# Patient Record
Sex: Male | Born: 1970 | Race: White | Hispanic: No | Marital: Married | Smoking: Never smoker
Health system: Southern US, Community
[De-identification: ages and names within clinical notes are randomized; demographics above are authoritative.]

## PROBLEM LIST (undated history)

## (undated) DIAGNOSIS — D649 Anemia, unspecified: Secondary | ICD-10-CM

## (undated) HISTORY — DX: Anemia, unspecified: D64.9

## (undated) HISTORY — PX: INGUINAL LYMPHADENECTOMY: SHX6587

---

## 2012-01-07 HISTORY — PX: CERVICAL FUSION: SHX112

## 2018-11-30 ENCOUNTER — Encounter: Payer: Self-pay | Admitting: Internal Medicine

## 2018-11-30 ENCOUNTER — Ambulatory Visit (AMBULATORY_SURGERY_CENTER): Payer: BC Managed Care – PPO | Admitting: *Deleted

## 2018-11-30 ENCOUNTER — Other Ambulatory Visit: Payer: Self-pay

## 2018-11-30 ENCOUNTER — Encounter (INDEPENDENT_AMBULATORY_CARE_PROVIDER_SITE_OTHER): Payer: Self-pay

## 2018-11-30 VITALS — Temp 97.8°F | Ht 70.0 in | Wt 210.0 lb

## 2018-11-30 DIAGNOSIS — Z1159 Encounter for screening for other viral diseases: Secondary | ICD-10-CM

## 2018-11-30 DIAGNOSIS — Z1211 Encounter for screening for malignant neoplasm of colon: Secondary | ICD-10-CM

## 2018-11-30 MED ORDER — NA SULFATE-K SULFATE-MG SULF 17.5-3.13-1.6 GM/177ML PO SOLN: 1.0000 | Freq: Once | ORAL | 0 refills | Status: AC

## 2018-11-30 NOTE — Progress Notes (Signed)

## 2018-12-13 ENCOUNTER — Other Ambulatory Visit: Payer: Self-pay | Admitting: Internal Medicine

## 2018-12-13 ENCOUNTER — Ambulatory Visit (INDEPENDENT_AMBULATORY_CARE_PROVIDER_SITE_OTHER): Payer: BC Managed Care – PPO

## 2018-12-13 DIAGNOSIS — Z1159 Encounter for screening for other viral diseases: Secondary | ICD-10-CM

## 2018-12-14 LAB — SARS CORONAVIRUS 2 (TAT 6-24 HRS): SARS Coronavirus 2: NEGATIVE

## 2018-12-16 ENCOUNTER — Ambulatory Visit (AMBULATORY_SURGERY_CENTER): Payer: BC Managed Care – PPO | Admitting: Internal Medicine

## 2018-12-16 ENCOUNTER — Other Ambulatory Visit: Payer: Self-pay

## 2018-12-16 ENCOUNTER — Encounter: Payer: Self-pay | Admitting: Internal Medicine

## 2018-12-16 VITALS — BP 112/68 | HR 69 | Temp 97.9°F | Resp 15 | Ht 70.0 in | Wt 210.0 lb

## 2018-12-16 DIAGNOSIS — Z800 Family history of malignant neoplasm of digestive organs: Secondary | ICD-10-CM | POA: Diagnosis present

## 2018-12-16 DIAGNOSIS — D124 Benign neoplasm of descending colon: Secondary | ICD-10-CM

## 2018-12-16 DIAGNOSIS — Z1211 Encounter for screening for malignant neoplasm of colon: Secondary | ICD-10-CM

## 2018-12-16 MED ORDER — SODIUM CHLORIDE 0.9 % IV SOLN: 500.0000 mL | Freq: Once | INTRAVENOUS | Status: DC

## 2018-12-16 NOTE — Progress Notes (Signed)
Called to room to assist during endoscopic procedure.  Patient ID and intended procedure confirmed with present staff. Received instructions for my participation in the procedure from the performing physician.  

## 2018-12-16 NOTE — Progress Notes (Signed)
PT taken to PACU. Monitors in place. VSS. Report given to RN. 

## 2018-12-16 NOTE — Patient Instructions (Signed)
One polyp. Await pathology.  You will receive a letter in 1-3 weeks from Dr Hilarie Fredrickson in the mail.      YOU HAD AN ENDOSCOPIC PROCEDURE TODAY AT [facility]:   Refer to the procedure report that was given to you for any specific questions about what was found during the examination.  If the procedure report does not answer your questions, please call your gastroenterologist to clarify.  If you requested that your care partner not be given the details of your procedure findings, then the procedure report has been included in a sealed envelope for you to review at your convenience later.  YOU SHOULD EXPECT: Some feelings of bloating in the abdomen. Passage of more gas than usual.  Walking can help get rid of the air that was put into your GI tract during the procedure and reduce the bloating. If you had a lower endoscopy (such as a colonoscopy or flexible sigmoidoscopy) you may notice spotting of blood in your stool or on the toilet paper. If you underwent a bowel prep for your procedure, you may not have a normal bowel movement for a few days.  Please Note:  You might notice some irritation and congestion in your nose or some drainage.  This is from the oxygen used during your procedure.  There is no need for concern and it should clear up in a day or so.  SYMPTOMS TO REPORT IMMEDIATELY:   Following lower endoscopy (colonoscopy or flexible sigmoidoscopy):  Excessive amounts of blood in the stool  Significant tenderness or worsening of abdominal pains  Swelling of the abdomen that is new, acute  Fever of 100F or higher   For urgent or emergent issues, a gastroenterologist can be reached at any hour by calling [phone].   DIET:  We do recommend a small meal at first, but then you may proceed to your regular diet.  Drink plenty of fluids but you should avoid alcoholic beverages for 24 hours.  ACTIVITY:  You should plan to take it easy for the rest of today and you should  NOT DRIVE or use heavy machinery until tomorrow (because of the sedation medicines used during the test).    FOLLOW UP: Our staff will call the number listed on your records 48-72 hours following your procedure to check on you and address any questions or concerns that you may have regarding the information given to you following your procedure. If we do not reach you, we will leave a message.  We will attempt to reach you two times.  During this call, we will ask if you have developed any symptoms of COVID 19. If you develop any symptoms (ie: fever, flu-like symptoms, shortness of breath, cough etc.) before then, please call [phone].  If you test positive for Covid 19 in the 2 weeks post procedure, please call and report this information to [place].    If any biopsies were taken you will be contacted by phone or by letter within the next 1-3 weeks.  Please call us at [phone] if you have not heard about the biopsies in 3 weeks.    SIGNATURES/CONFIDENTIALITY: You and/or your care partner have signed paperwork which will be entered into your electronic medical record.  These signatures attest to the fact that that the information above on your After Visit Summary has been reviewed and is understood.  Full responsibility of the confidentiality of this discharge information lies with you and/or your care-partner.

## 2018-12-16 NOTE — Op Note (Signed)
[place] Patient Name: Hunter Lambert Procedure Date: 12/16/2018 9:31 AM MRN: [id number] Endoscopist: Jerene Bears , MD Age: 48 Referring MD:  Date of Birth: 24-Mar-1970 Gender: Male Account #: [id number] Procedure:                Colonoscopy Indications:              Screening patient at increased risk: Family history                            of 1st-degree relative with colorectal cancer at                            age 46 years (or older), This is the patient's                            first colonoscopy Medicines:                Monitored Anesthesia Care Procedure:                Pre-Anesthesia Assessment:                           - Prior to the procedure, a History and Physical                            was performed, and patient medications and                            allergies were reviewed. The patient's tolerance of                            previous anesthesia was also reviewed. The risks                            and benefits of the procedure and the sedation                            options and risks were discussed with the patient.                            All questions were answered, and informed consent                            was obtained. Prior Anticoagulants: The patient has                            taken no previous anticoagulant or antiplatelet                            agents. ASA Grade Assessment: I - A normal, healthy                            patient. After reviewing the risks and benefits,  the patient was deemed in satisfactory condition to                            undergo the procedure.                           After obtaining informed consent, the colonoscope                            was passed under direct vision. Throughout the                            procedure, the patient's blood pressure, pulse, and                            oxygen saturations were monitored continuously. The                    Colonoscope was introduced through the anus and                            advanced to the cecum. The colonoscopy was                            performed without difficulty. The patient tolerated                            the procedure well. The quality of the bowel                            preparation was good. The ileocecal valve,                            appendiceal orifice, and rectum were photographed. Scope In: 9:41:38 AM Scope Out: [id number] AM Scope Withdrawal Time: 0 hours 12 minutes 17 seconds  Total Procedure Duration: 0 hours 14 minutes 39 seconds  Findings:                 The digital rectal exam was normal.                           A 3 mm polyp was found in the descending colon. The                            polyp was sessile. The polyp was removed with a                            cold snare. Resection and retrieval were complete.                           The exam was otherwise without abnormality on                            direct and retroflexion views. Complications:            No  immediate complications. Estimated Blood Loss:     Estimated blood loss: none. Impression:               - One 3 mm polyp in the descending colon, removed                            with a cold snare. Resected and retrieved.                           - The examination was otherwise normal on direct                            and retroflexion views. Recommendation:           - Patient has a contact number available for                            emergencies. The signs and symptoms of potential                            delayed complications were discussed with the                            patient. Return to normal activities tomorrow.                            Written discharge instructions were provided to the                            patient.                           - Resume previous diet.                           - Continue present medications.                            - Await pathology results.                           - Repeat colonoscopy is recommended. The                            colonoscopy date will be determined after pathology                            results from today's exam become available for                            review. Jerene Bears, MD 12/16/2018 9:59:52 AM This report has been signed electronically.

## 2018-12-16 NOTE — Progress Notes (Signed)
VS-DT Temp-JB   Pt's states no medical or surgical changes since previsit or office visit.  

## 2018-12-20 ENCOUNTER — Telehealth: Payer: Self-pay | Admitting: *Deleted

## 2018-12-20 ENCOUNTER — Encounter: Payer: Self-pay | Admitting: Internal Medicine

## 2018-12-20 NOTE — Telephone Encounter (Signed)
  Follow up Call-  Call back number 12/16/2018  Post procedure Call Back phone  # [phone]  Permission to leave phone message Yes     Patient questions:  Do you have a fever, pain , or abdominal swelling? No. Pain Score  0 *  Have you tolerated food without any problems? Yes.    Have you been able to return to your normal activities? Yes.    Do you have any questions about your discharge instructions: Diet   No. Medications  No. Follow up visit  No.  Do you have questions or concerns about your Care? No.  Actions: * If pain score is 4 or above: No action needed, pain <4.  1. Have you developed a fever since your procedure? no  2.   Have you had an respiratory symptoms (SOB or cough) since your procedure? no  3.   Have you tested positive for COVID 19 since your procedure no  4.   Have you had any family members/close contacts diagnosed with the COVID 19 since your procedure?  no   If yes to any of these questions please route to Joylene John, RN and Alphonsa Gin, Therapist, sports.

## 2019-03-06 ENCOUNTER — Other Ambulatory Visit: Payer: Self-pay

## 2019-03-06 ENCOUNTER — Emergency Department (HOSPITAL_COMMUNITY): Payer: BC Managed Care – PPO

## 2019-03-06 ENCOUNTER — Emergency Department (HOSPITAL_COMMUNITY)
Admission: EM | Admit: 2019-03-06 | Discharge: 2019-03-06 | Disposition: A | Discharge status: Home / Self Care | Payer: BC Managed Care – PPO | Attending: Emergency Medicine | Admitting: Emergency Medicine

## 2019-03-06 ENCOUNTER — Encounter (HOSPITAL_COMMUNITY): Payer: Self-pay | Admitting: *Deleted

## 2019-03-06 DIAGNOSIS — U071 COVID-19: Secondary | ICD-10-CM

## 2019-03-06 DIAGNOSIS — Z7982 Long term (current) use of aspirin: Secondary | ICD-10-CM | POA: Insufficient documentation

## 2019-03-06 DIAGNOSIS — Z20822 Contact with and (suspected) exposure to covid-19: Secondary | ICD-10-CM | POA: Diagnosis not present

## 2019-03-06 DIAGNOSIS — R509 Fever, unspecified: Secondary | ICD-10-CM | POA: Insufficient documentation

## 2019-03-06 MED ORDER — BENZONATATE 100 MG PO CAPS: 100.0000 mg | ORAL_CAPSULE | Freq: Three times a day (TID) | ORAL | 0 refills | Status: DC | PRN

## 2019-03-06 NOTE — ED Triage Notes (Signed)
Pt wife was diagnosed with covid and he has had the same symptoms for 10 days.  He has not had a covid test but assumes that he is covid positive.  Pt has continued to have fevers, today he had a fever of 101F.  Pt has been taking tylenol for this and the last dose was 2 hours ago.  Pt took dexamethasone po which was prescribed by his PCP, he also took singulair.  Pt states that he is concerned due to the continued fever and felt he should be evaluated.  Pt appears in no distress in triage.

## 2019-03-06 NOTE — ED Provider Notes (Signed)
[facility] EMERGENCY DEPARTMENT Provider Note   CSN: [id number] Arrival date & time: 03/06/19  1124     History Chief Complaint  Patient presents with  . Fever    COVID    Hunter Lambert is a 49 y.o. male with past medical hsitory significant for anemia presents to emergency department today with chief complaint of intermittent fever x 10 days. Patient states his wife tested positive for covid x 10 days ago. He was never tested but has been self quarantining since he had similar symptoms.  He endorses loss of sense of taste and smell as well as a nonproductive cough. He does admit to feeling short of breath after coughing episode. He was prescribed dexamethasone taper from his PCP states he took that with symptom improvement, still has 2 days remaining in taper.  He has continued to have fever upon waking up in the morning the last several days, he states this morning it was 101, he took Tylenol and fever resolved.  He last took Tylenol 2 hours prior to arrival. He denies any associated chills, congestion, sore throat, chest pain, dyspnea with exertion, abdominal pain, nausea, vomiting, urinary symptoms, diarrhea, rash.  History provided by patient with additional history obtained from chart review.      Past Medical History:  Diagnosis Date  . Anemia     There are no problems to display for this patient.   Past Surgical History:  Procedure Laterality Date  . CERVICAL FUSION  2014   c5-c6       Family History  Problem Relation Age of Onset  . Colon cancer Mother   . Rectal cancer Neg Hx   . Stomach cancer Neg Hx   . Esophageal cancer Neg Hx     Social History   Tobacco Use  . Smoking status: Never Smoker  . Smokeless tobacco: Never Used  Substance Use Topics  . Alcohol use: Yes    Alcohol/week: 2.0 standard drinks    Types: 2 Standard drinks or equivalent per week  . Drug use: Never    Home Medications Prior to Admission medications   Medication Sig Start  Date End Date Taking? Authorizing Provider  aspirin EC 81 MG tablet Take 81 mg by mouth daily.    [provider]  benzonatate (TESSALON) 100 MG capsule Take 1 capsule (100 mg total) by mouth every 8 (eight) hours as needed for cough. 03/06/19   Chereese Cilento, Harley Hallmark, PA-C    Allergies    Patient has no known allergies.  Review of Systems   Review of Systems All other systems are reviewed and are negative for acute change except as noted in the HPI.  Physical Exam Updated Vital Signs BP 136/79 (BP Location: Right Arm)   Pulse 78   Temp 98.7 F (37.1 C) (Oral)   Resp 16   Ht 5\' 9"  (1.753 m)   Wt 93 kg   SpO2 97%   BMI 30.27 kg/m   Physical Exam Vitals and nursing note reviewed.  Constitutional:      General: He is not in acute distress.    Appearance: He is not ill-appearing.  HENT:     Head: Normocephalic and atraumatic.     Comments: No sinus or temporal tenderness.    Right Ear: Tympanic membrane and external ear normal.     Left Ear: Tympanic membrane and external ear normal.     Nose: Nose normal. No congestion.     Mouth/Throat:  Mouth: Mucous membranes are moist.     Pharynx: Oropharynx is clear.     Comments: No erythema to oropharynx, no edema, no exudate, no tonsillar swelling, voice normal, neck supple without lymphadenopathy  Eyes:     General: No scleral icterus.       Right eye: No discharge.        Left eye: No discharge.     Extraocular Movements: Extraocular movements intact.     Conjunctiva/sclera: Conjunctivae normal.     Pupils: Pupils are equal, round, and reactive to light.  Neck:     Vascular: No JVD.  Cardiovascular:     Rate and Rhythm: Normal rate and regular rhythm.     Pulses: Normal pulses.          Radial pulses are 2+ on the right side and 2+ on the left side.     Heart sounds: Normal heart sounds.  Pulmonary:     Comments: Dry hacking cough during exam.  Lungs clear to auscultation in all Buckels. Symmetric chest rise. No  wheezing, rales, or rhonchi.  SPO2 is 96% on room air during exam. Abdominal:     Comments: Abdomen is soft, non-distended, and non-tender in all quadrants. No rigidity, no guarding. No peritoneal signs.  Musculoskeletal:        General: Normal range of motion.     Cervical back: Normal range of motion.  Skin:    General: Skin is warm and dry.     Capillary Refill: Capillary refill takes less than 2 seconds.  Neurological:     Mental Status: He is oriented to person, place, and time.     GCS: GCS eye subscore is 4. GCS verbal subscore is 5. GCS motor subscore is 6.     Comments: Fluent speech, no facial droop.  Psychiatric:        Behavior: Behavior normal.     ED Results / Procedures / Treatments   Labs (all labs ordered are listed, but only abnormal results are displayed) Labs Reviewed - No data to display  EKG None  Radiology DG Chest [place] Day 1 View  Result Date: 03/06/2019 CLINICAL DATA:  Wife is diagnosed with COVID-19.  Fevers. EXAM: PORTABLE CHEST 1 VIEW COMPARISON:  None. FINDINGS: Hazy right basilar airspace disease likely reflecting atelectasis. No focal consolidation, pleural effusion or pneumothorax. Normal cardiomediastinal silhouette. No acute osseous abnormality. IMPRESSION: No acute cardiopulmonary disease. Electronically Signed   By: Kathreen Devoid   On: 03/06/2019 12:32    Procedures Procedures (including critical care time)  Medications Ordered in ED Medications - No data to display  ED Course  I have reviewed the triage vital signs and the nursing notes.  Pertinent labs & imaging results that were available during my care of the patient were reviewed by me and considered in my medical decision making (see chart for details).    MDM Rules/Calculators/A&P                      Patient seen and examined. Patient presents awake, alert, hemodynamically stable, afebrile, non toxic. His temperature in triage is 98.7, last took Tylenol 2 hours prior to arrival.   Patient is overall well-appearing.  His lungs are clear to auscultation in all [place] however he does have a dry hacking cough.  No hypoxia, no wheezing or rales. No sinus tenderness, no abdominal tenderness.  Patient is requesting to have a chest x-ray given his continued fever and has concern for worsening  infection. Engaged in shared decision making regarding blood work.  At this time patient does not wish to have lab work drawn. This seems reasonable as patient denies any chest pain or dyspnea on exertion.  Chest xray viewed by me shows atelectasis in right lung base.  Discussed results with patient and gave incentive spirometer. I went over instructions how to use incentive spirometer.  The patient appears reasonably screened and/or stabilized for discharge and I doubt any other medical condition or other [facility] requiring further screening, evaluation, or treatment in the ED at this time prior to discharge. The patient is safe for discharge with strict return precautions discussed. Recommend pcp follow up if symptoms persist.   Kasai Browder was evaluated in Emergency Department on 03/06/2019 for the symptoms described in the history of present illness. He was evaluated in the context of the global COVID-19 pandemic, which necessitated consideration that the patient might be at risk for infection with the SARS-CoV-2 virus that causes COVID-19. Institutional protocols and algorithms that pertain to the evaluation of patients at risk for COVID-19 are in a state of rapid change based on information released by regulatory bodies including the CDC and federal and state organizations. These policies and algorithms were followed during the patient's care in the ED.  Portions of this note were generated with [employer]. Dictation errors may occur despite best attempts at proofreading.   Final Clinical Impression(s) / ED Diagnoses Final diagnoses:  Close exposure to COVID-19 virus    Rx / DC  Orders ED Discharge Orders         Ordered    benzonatate (TESSALON) 100 MG capsule  Every 8 hours PRN     03/06/19 1314           Rozlyn Yerby, [place]rie Noe 03/06/19 1350    Milton Ferguson, MD 03/06/19 1438

## 2019-03-06 NOTE — Discharge Instructions (Addendum)
Thank you for allowing [place] to care for you today.   Your chest x-ray did not show any signs of pneumonia.  There was possible atelectasis in your right lung base.  Please use the incentive spirometer every hour while you are awake to encourage you to take good deep breaths.  Please return to the emergency department if you have any new or worsening symptoms.   Medications- You can take medications to help treat your symptoms: -A prescription was sent to your pharmacy for Tessalon.  As we discussed this is a cough medicine.  You can take this as prescribed to try to help with coughing.  If the medicine does not work then I recommend going back to the over-the-counter treatments you were using before. -Tylenol for fever and body aches. Please take as prescribed on the bottle. No more than 4g per day. -Flonase or saline nasal spray for nasal congestion -Vitamins as recommended by CDC  Treatment- This is a virus and unfortunately there are no antibitotics approved to treat this virus at this time. It is important to monitor your symptoms closely: -You should have a theremometer at home to check your temperature when feeling feverish. -Use a pulse ox meter to measure your oxygen when feeling short of breath.  -If your fever is over 100.4 despite taking tylenol or if your oxygen level drops below 94% these are reasons to rturn to the emergency department for further evaluation. Please call the emergency department before you come to make [place] aware.    We recommend you self-isolate for 10 days and to inform your work/family/friends that you has the virus.  They will need to self-quarantine for 14 days to monitor for symptoms.    -You should not return to work until you are fever free for 3 days without the use of Tylenol or any other medicines.  Again: symptoms of shortness of breath, chest pain, difficulty breathing, new onset of confusion, any symptoms that are concerning. If any of these symptoms  you should come to emergency department for evaluation.   I hope you feel better soon

## 2020-11-22 IMAGING — DX DG CHEST 1V PORT
1 series · 1 of 1 positions shown · non-contrast
Comparison: None.

CLINICAL DATA: Wife is diagnosed with APGSW-21.  Fevers.

EXAM:
PORTABLE CHEST 1 VIEW

[chest ap]
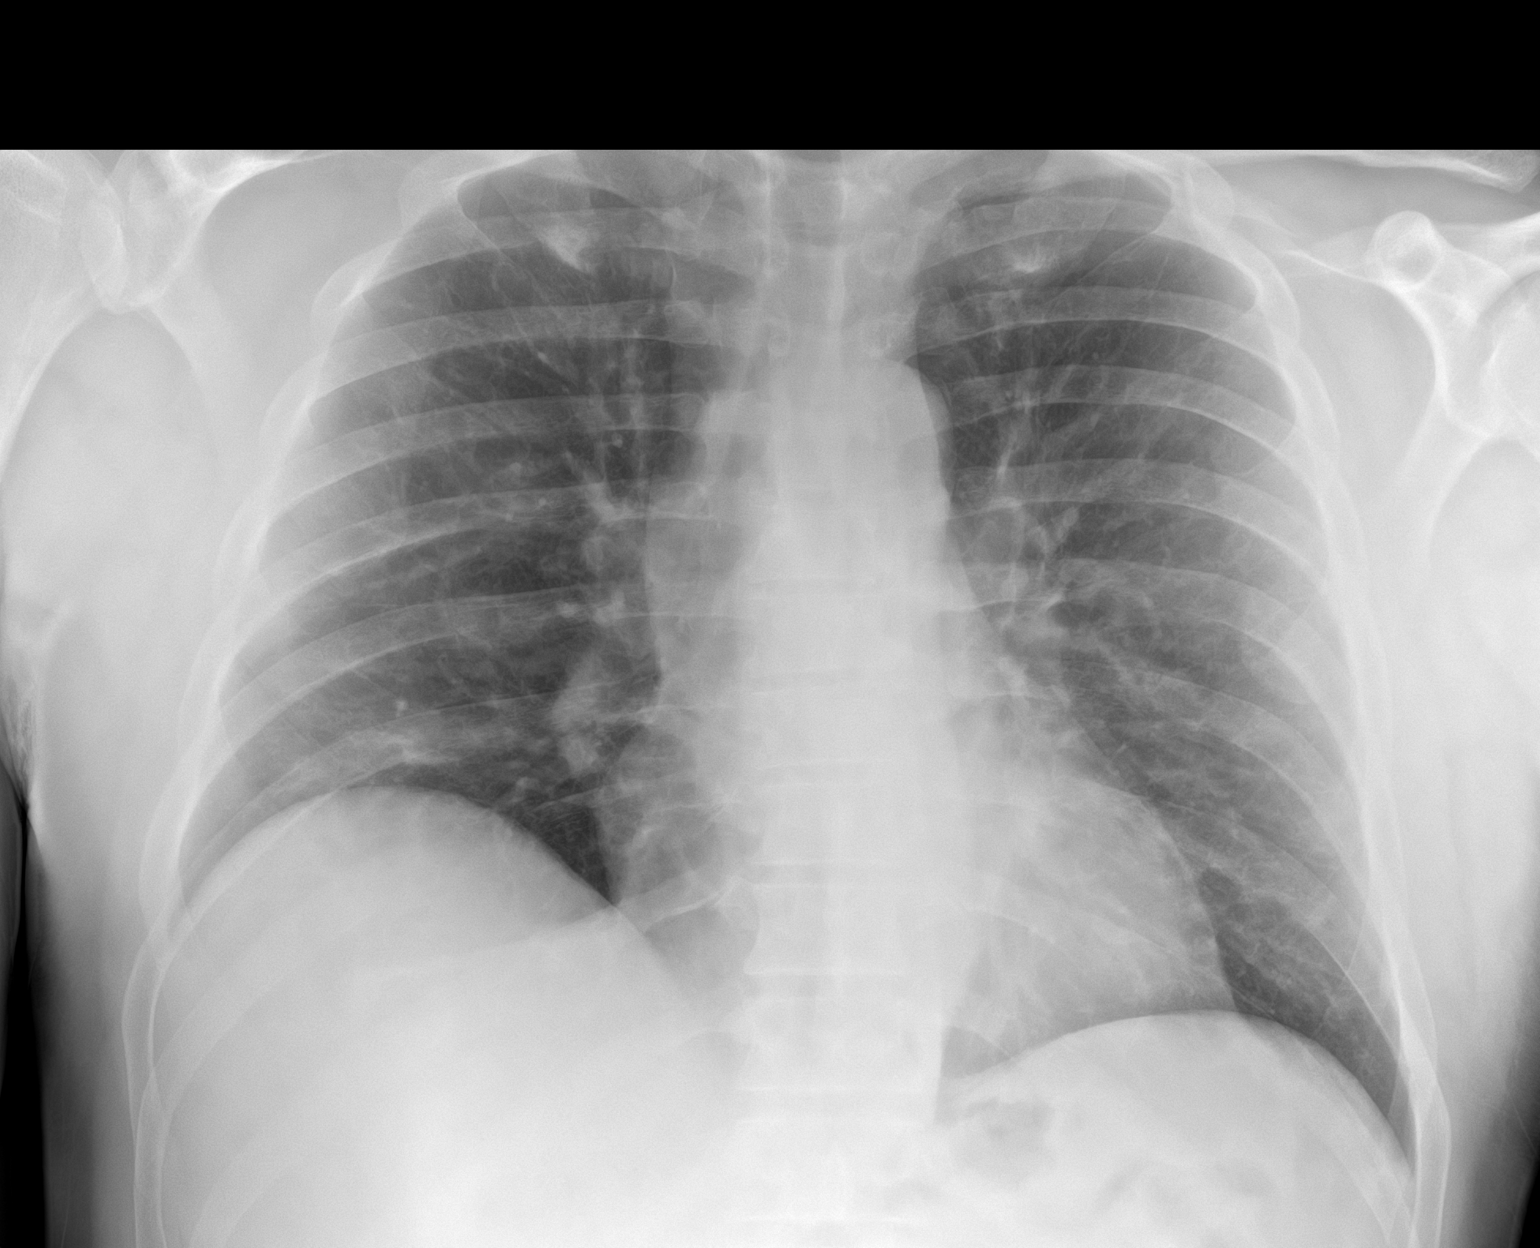

[1 of 1 positions shown; findings below may reference images not displayed]

FINDINGS: Hazy right basilar airspace disease likely reflecting atelectasis.
No focal consolidation, pleural effusion or pneumothorax. Normal
cardiomediastinal silhouette. No acute osseous abnormality.
IMPRESSION: No acute cardiopulmonary disease.

## 2021-12-24 ENCOUNTER — Other Ambulatory Visit (HOSPITAL_COMMUNITY)
Admission: RE | Admit: 2021-12-24 | Discharge: 2021-12-24 | Disposition: A | Discharge status: Home / Self Care | Payer: BC Managed Care – PPO | Source: Ambulatory Visit | Attending: Surgery | Admitting: Surgery

## 2021-12-24 DIAGNOSIS — R590 Localized enlarged lymph nodes: Secondary | ICD-10-CM | POA: Insufficient documentation

## 2021-12-27 LAB — SURGICAL PATHOLOGY

## 2023-10-23 ENCOUNTER — Encounter: Payer: Self-pay | Admitting: Internal Medicine

## 2023-11-27 ENCOUNTER — Ambulatory Visit (AMBULATORY_SURGERY_CENTER): Payer: Self-pay

## 2023-11-27 VITALS — Ht 69.0 in | Wt 218.6 lb

## 2023-11-27 DIAGNOSIS — Z800 Family history of malignant neoplasm of digestive organs: Secondary | ICD-10-CM

## 2023-11-27 MED ORDER — NA SULFATE-K SULFATE-MG SULF 17.5-3.13-1.6 GM/177ML PO SOLN: 1.0000 | Freq: Once | ORAL | 0 refills | Status: AC

## 2023-11-27 NOTE — Progress Notes (Signed)

## 2023-12-10 ENCOUNTER — Encounter: Payer: Self-pay | Admitting: Internal Medicine

## 2023-12-18 ENCOUNTER — Encounter: Payer: Self-pay | Admitting: Internal Medicine

## 2023-12-18 ENCOUNTER — Ambulatory Visit: Admitting: Internal Medicine

## 2023-12-18 VITALS — BP 106/79 | HR 69 | Temp 98.8°F | Resp 15 | Ht 69.0 in | Wt 218.6 lb

## 2023-12-18 DIAGNOSIS — Z800 Family history of malignant neoplasm of digestive organs: Secondary | ICD-10-CM | POA: Diagnosis not present

## 2023-12-18 DIAGNOSIS — Z860101 Personal history of adenomatous and serrated colon polyps: Secondary | ICD-10-CM

## 2023-12-18 DIAGNOSIS — K5730 Diverticulosis of large intestine without perforation or abscess without bleeding: Secondary | ICD-10-CM

## 2023-12-18 DIAGNOSIS — Z1211 Encounter for screening for malignant neoplasm of colon: Secondary | ICD-10-CM | POA: Diagnosis present

## 2023-12-18 MED ORDER — SODIUM CHLORIDE 0.9 % IV SOLN: 500.0000 mL | Freq: Once | INTRAVENOUS | Status: DC

## 2023-12-18 NOTE — Progress Notes (Signed)
VS by KW  Pt's states no medical or surgical changes since previsit or office visit.  

## 2023-12-18 NOTE — Progress Notes (Signed)
 Sedate, gd SR, tolerated procedure well, VSS, report to RN

## 2023-12-18 NOTE — Progress Notes (Signed)
 GASTROENTEROLOGY PROCEDURE H&P NOTE   Primary Care Physician: Donnise Norleen Lenis, MD    Reason for Procedure:  History of adenoma and family history of colon cancer  Plan:    Colonoscopy  Patient is appropriate for endoscopic procedure(s) in the ambulatory ([facility]) setting.  The nature of the procedure, as well as the risks, benefits, and alternatives were carefully and thoroughly reviewed with the patient. Ample time for discussion and questions allowed.  All questions were answered. The patient understood, was satisfied, and agreed with the plan to proceed.    HPI: Hunter Lambert is a 53 y.o. male who presents for colonoscopy.  Medical history as below.  Tolerated the prep.  No recent chest pain or shortness of breath.  No abdominal pain today.  Past Medical History:  Diagnosis Date   Anemia     Past Surgical History:  Procedure Laterality Date   CERVICAL FUSION  2014   c5-c6   INGUINAL LYMPHADENECTOMY     12/2021    Prior to Admission medications  Medication Sig Start Date End Date Taking? Authorizing Provider  aspirin EC 81 MG tablet Take 81 mg by mouth daily.   Yes [provider]    Current Outpatient Medications  Medication Sig Dispense Refill   aspirin EC 81 MG tablet Take 81 mg by mouth daily.     Current Facility-Administered Medications  Medication Dose Route Frequency Provider Last Rate Last Admin   0.9 %  sodium chloride  infusion  500 mL Intravenous Once Laurynn Mccorvey, Gordy HERO, MD        Allergies as of 12/18/2023   (No Known Allergies)    Family History  Problem Relation Age of Onset   Colon cancer Mother    Rectal cancer Neg Hx    Stomach cancer Neg Hx    Esophageal cancer Neg Hx     Social History   Socioeconomic History   Marital status: Married    Spouse name: Not on file   Number of children: Not on file   Years of education: Not on file   Highest education level: Not on file  Occupational History   Not on file  Tobacco Use    Smoking status: Never   Smokeless tobacco: Never  Vaping Use   Vaping status: Never Used  Substance and Sexual Activity   Alcohol use: Yes    Alcohol/week: 2.0 standard drinks of alcohol    Types: 2 Standard drinks or equivalent per week   Drug use: Never   Sexual activity: Not on file  Other Topics Concern   Not on file  Social History Narrative   Not on file   Social Drivers of Health   Tobacco Use: Low Risk (12/18/2023)   Patient History    Smoking Tobacco Use: Never    Smokeless Tobacco Use: Never    Passive Exposure: Not on file  Financial Resource Strain: Not on file  Food Insecurity: Not on file  Transportation Needs: Not on file  Physical Activity: Not on file  Stress: Not on file  Social Connections: Not on file  Intimate Partner Violence: Not on file  Depression (EYV7-0): Not on file  Alcohol Screen: Not on file  Housing: Not on file  Utilities: Not on file  Health Literacy: Not on file    Physical Exam: Vital signs in last 24 hours: @BP  (!) 145/70   Pulse 73   Temp 98.8 F (37.1 C)   Resp (!) 21   Ht 5' 9 (  1.753 m)   Wt 218 lb 9.6 oz (99.2 kg)   SpO2 97%   BMI 32.28 kg/m  GEN: NAD EYE: Sclerae anicteric ENT: MMM CV: Non-tachycardic Pulm: CTA b/l GI: Soft, NT/ND NEURO:  Alert & Oriented x 3   Gordy Starch, MD [facility]  12/18/2023 8:06 AM

## 2023-12-18 NOTE — Patient Instructions (Signed)
Handout provided about diverticulosis.  Resume previous diet.  Continue present medications.  Repeat colonoscopy in 5 years for surveillance.   YOU HAD AN ENDOSCOPIC PROCEDURE TODAY AT [facility]:   Refer to the procedure report that was given to you for any specific questions about what was found during the examination.  If the procedure report does not answer your questions, please call your gastroenterologist to clarify.  If you requested that your care partner not be given the details of your procedure findings, then the procedure report has been included in a sealed envelope for you to review at your convenience later.  YOU SHOULD EXPECT: Some feelings of bloating in the abdomen. Passage of more gas than usual.  Walking can help get rid of the air that was put into your GI tract during the procedure and reduce the bloating. If you had a lower endoscopy (such as a colonoscopy or flexible sigmoidoscopy) you may notice spotting of blood in your stool or on the toilet paper. If you underwent a bowel prep for your procedure, you may not have a normal bowel movement for a few days.  Please Note:  You might notice some irritation and congestion in your nose or some drainage.  This is from the oxygen used during your procedure.  There is no need for concern and it should clear up in a day or so.  SYMPTOMS TO REPORT IMMEDIATELY:  Following lower endoscopy (colonoscopy or flexible sigmoidoscopy):  Excessive amounts of blood in the stool  Significant tenderness or worsening of abdominal pains  Swelling of the abdomen that is new, acute  Fever of 100F or higher  For urgent or emergent issues, a gastroenterologist can be reached at any hour by calling [phone]. Do not use MyChart messaging for urgent concerns.    DIET:  We do recommend a small meal at first, but then you may proceed to your regular diet.  Drink plenty of fluids but you should avoid alcoholic beverages for 24  hours.  ACTIVITY:  You should plan to take it easy for the rest of today and you should NOT DRIVE or use heavy machinery until tomorrow (because of the sedation medicines used during the test).    FOLLOW UP: Our staff will call the number listed on your records the next business day following your procedure.  We will call around 7:15- 8:00 am to check on you and address any questions or concerns that you may have regarding the information given to you following your procedure. If we do not reach you, we will leave a message.     If any biopsies were taken you will be contacted by phone or by letter within the next 1-3 weeks.  Please call us at [phone] if you have not heard about the biopsies in 3 weeks.    SIGNATURES/CONFIDENTIALITY: You and/or your care partner have signed paperwork which will be entered into your electronic medical record.  These signatures attest to the fact that that the information above on your After Visit Summary has been reviewed and is understood.  Full responsibility of the confidentiality of this discharge information lies with you and/or your care-partner.

## 2023-12-18 NOTE — Op Note (Signed)
  [facility] Patient Name: Hunter Lambert Procedure Date: 12/18/2023 8:07 AM MRN: [id number] Endoscopist: Gordy CHRISTELLA Starch , MD, [id number] Age: 53 Referring MD:  Date of Birth: 02-22-1970 Gender: Male Account #: [id number] Procedure:                Colonoscopy Indications:              High risk colon cancer surveillance: Personal                            history of non-advanced adenoma, Family history of                            colon cancer in a first-degree relative (mother),                            Last colonoscopy: December 2020 (TA x 1) index exam Medicines:                Monitored Anesthesia Care Procedure:                Pre-Anesthesia Assessment:                           - Prior to the procedure, a History and Physical                            was performed, and patient medications and                            allergies were reviewed. The patient's tolerance of                            previous anesthesia was also reviewed. The risks                            and benefits of the procedure and the sedation                            options and risks were discussed with the patient.                            All questions were answered, and informed consent                            was obtained. Prior Anticoagulants: The patient has                            taken no anticoagulant or antiplatelet agents. ASA                            Grade Assessment: I - A normal, healthy patient.                            After reviewing the risks and benefits, the patient  was deemed in satisfactory condition to undergo the                            procedure.                           After obtaining informed consent, the colonoscope                            was passed under direct vision. Throughout the                            procedure, the patient's blood pressure, pulse, and                            oxygen saturations were  monitored continuously. The                            CF [id number] #[id number] was introduced through the anus                            and advanced to the terminal ileum. The colonoscopy                            was performed without difficulty. The patient                            tolerated the procedure well. The quality of the                            bowel preparation was excellent. The ileocecal                            valve, appendiceal orifice, and rectum were                            photographed. Scope In: 8:12:25 AM Scope Out: 8:22:48 AM Scope Withdrawal Time: 0 hours 8 minutes 10 seconds  Total Procedure Duration: 0 hours 10 minutes 23 seconds  Findings:                 The digital rectal exam was normal.                           The terminal ileum appeared normal.                           A few small-mouthed diverticula were found in the                            sigmoid colon.                           The exam was otherwise without abnormality on  direct and retroflexion views. Complications:            No immediate complications. Estimated Blood Loss:     Estimated blood loss: none. Impression:               - The examined portion of the ileum was normal.                           - Mild diverticulosis in the sigmoid colon.                           - The examination was otherwise normal on direct                            and retroflexion views.                           - No specimens collected. Recommendation:           - Patient has a contact number available for                            emergencies. The signs and symptoms of potential                            delayed complications were discussed with the                            patient. Return to normal activities tomorrow.                            Written discharge instructions were provided to the                            patient.                           - Resume  previous diet.                           - Continue present medications.                           - Repeat colonoscopy in 5 years for surveillance. Gordy CHRISTELLA Starch, MD 12/18/2023 8:28:21 AM This report has been signed electronically.

## 2023-12-21 ENCOUNTER — Telehealth: Payer: Self-pay | Admitting: *Deleted

## 2023-12-21 NOTE — Telephone Encounter (Signed)
°  Follow up Call-     12/18/2023    7:31 AM  Call back number  Post procedure Call Back phone  # [phone]  Permission to leave phone message Yes     Patient questions:  Do you have a fever, pain , or abdominal swelling? No. Pain Score  0 *  Have you tolerated food without any problems? Yes.    Have you been able to return to your normal activities? Yes.    Do you have any questions about your discharge instructions: Diet   No. Medications  No. Follow up visit  No.  Do you have questions or concerns about your Care? No.  Actions: * If pain score is 4 or above: No action needed, pain <4.
